# Patient Record
Sex: Female | Born: 1965 | Hispanic: Yes | Marital: Married
Health system: Southern US, Community
[De-identification: ages and names within clinical notes are randomized; demographics above are authoritative.]

---

## 2009-01-11 ENCOUNTER — Emergency Department: Payer: Self-pay | Admitting: Emergency Medicine

## 2013-06-23 LAB — URINALYSIS, COMPLETE
Bacteria: NONE SEEN
Bilirubin,UR: NEGATIVE
Blood: NEGATIVE
Ketone: NEGATIVE
Leukocyte Esterase: NEGATIVE
Nitrite: NEGATIVE
Protein: 30
Specific Gravity: 1.017 (ref 1.003–1.030)
Squamous Epithelial: 6
WBC UR: 1 /HPF (ref 0–5)

## 2013-06-23 LAB — CBC
HGB: 14.2 g/dL (ref 12.0–16.0)
MCH: 31.9 pg (ref 26.0–34.0)
MCHC: 34.9 g/dL (ref 32.0–36.0)
MCV: 92 fL (ref 80–100)
Platelet: 261 10*3/uL (ref 150–440)
RDW: 12.8 % (ref 11.5–14.5)
WBC: 13.1 10*3/uL — ABNORMAL HIGH (ref 3.6–11.0)

## 2013-06-23 LAB — COMPREHENSIVE METABOLIC PANEL
Albumin: 4.5 g/dL (ref 3.4–5.0)
Anion Gap: 8 (ref 7–16)
Bilirubin,Total: 1 mg/dL (ref 0.2–1.0)
Calcium, Total: 9.4 mg/dL (ref 8.5–10.1)
Chloride: 100 mmol/L (ref 98–107)
Co2: 26 mmol/L (ref 21–32)
Creatinine: 0.69 mg/dL (ref 0.60–1.30)
EGFR (African American): >60
EGFR (Non-African Amer.): >60
Glucose: 124 mg/dL — ABNORMAL HIGH (ref 65–99)
Osmolality: 269 (ref 275–301)
SGOT(AST): 21 U/L (ref 15–37)
Sodium: 134 mmol/L — ABNORMAL LOW (ref 136–145)
Total Protein: 8.3 g/dL — ABNORMAL HIGH (ref 6.4–8.2)

## 2013-06-23 LAB — LIPASE, BLOOD: Lipase: 92 U/L (ref 73–393)

## 2013-06-23 LAB — PREGNANCY, URINE: Pregnancy Test, Urine: NEGATIVE m[IU]/mL

## 2013-06-24 ENCOUNTER — Inpatient Hospital Stay: Payer: Self-pay | Admitting: Surgery

## 2013-06-25 LAB — CBC WITH DIFFERENTIAL/PLATELET
Basophil #: 0 10*3/uL (ref 0.0–0.1)
Basophil %: 0.2 %
Eosinophil #: 0 10*3/uL (ref 0.0–0.7)
Eosinophil %: 0 %
HCT: 34.7 % — ABNORMAL LOW (ref 35.0–47.0)
HGB: 12.3 g/dL (ref 12.0–16.0)
Lymphocyte #: 1.5 10*3/uL (ref 1.0–3.6)
Lymphocyte %: 17.1 %
MCH: 32.2 pg (ref 26.0–34.0)
MCHC: 35.4 g/dL (ref 32.0–36.0)
Monocyte %: 9.8 %
Neutrophil #: 6.4 10*3/uL (ref 1.4–6.5)
Neutrophil %: 72.9 %
RDW: 12.6 % (ref 11.5–14.5)
WBC: 8.7 10*3/uL (ref 3.6–11.0)

## 2013-06-27 ENCOUNTER — Emergency Department: Payer: Self-pay | Admitting: Emergency Medicine

## 2013-06-27 LAB — URINALYSIS, COMPLETE
Bacteria: NONE SEEN
Glucose,UR: NEGATIVE mg/dL (ref 0–75)
Ketone: NEGATIVE
Leukocyte Esterase: NEGATIVE
Ph: 7 (ref 4.5–8.0)
Protein: NEGATIVE
RBC,UR: 1 /HPF (ref 0–5)
Specific Gravity: 1.016 (ref 1.003–1.030)
Squamous Epithelial: 5
WBC UR: 1 /HPF (ref 0–5)

## 2013-06-27 LAB — COMPREHENSIVE METABOLIC PANEL
Albumin: 3.9 g/dL (ref 3.4–5.0)
Anion Gap: 7 (ref 7–16)
BUN: 11 mg/dL (ref 7–18)
Calcium, Total: 9.4 mg/dL (ref 8.5–10.1)
Co2: 25 mmol/L (ref 21–32)
Creatinine: 0.59 mg/dL — ABNORMAL LOW (ref 0.60–1.30)
EGFR (Non-African Amer.): >60
Osmolality: 268 (ref 275–301)
Potassium: 3.5 mmol/L (ref 3.5–5.1)
SGOT(AST): 28 U/L (ref 15–37)
SGPT (ALT): 48 U/L (ref 12–78)
Total Protein: 7.7 g/dL (ref 6.4–8.2)

## 2013-06-27 LAB — CBC
HCT: 39.4 % (ref 35.0–47.0)
HGB: 13.9 g/dL (ref 12.0–16.0)
MCH: 32.2 pg (ref 26.0–34.0)
MCHC: 35.4 g/dL (ref 32.0–36.0)
MCV: 91 fL (ref 80–100)
Platelet: 257 10*3/uL (ref 150–440)
RBC: 4.33 10*6/uL (ref 3.80–5.20)
RDW: 12.8 % (ref 11.5–14.5)
WBC: 10.3 10*3/uL (ref 3.6–11.0)

## 2015-01-08 NOTE — H&P (Signed)
Subjective/Chief Complaint RUQ pain   History of Present Illness 2 days RUQ pain rad to back nausea, mul;t emesis no jaundice no prior episode  Hx taken with interpretor   Past History HTN tubal lig   Past Medical Health Hypertension   Past Med/Surgical Hx:  HTN:   tubal ligation:   ALLERGIES:  No Known Allergies:   Family and Social History:  Family History father had chole   Social History negative tobacco, negative ETOH   Place of Living Home   Review of Systems:  Fever/Chills No   Cough No   Abdominal Pain Yes   Diarrhea No   Constipation No   Nausea/Vomiting Yes   SOB/DOE No   Chest Pain No   Dysuria No   Tolerating Diet No  Nauseated  Vomiting   Medications/Allergies Reviewed Medications/Allergies reviewed   Physical Exam:  GEN no acute distress   HEENT pink conjunctivae   NECK supple   RESP normal resp effort  clear BS  no use of accessory muscles   CARD regular rate   ABD positive tenderness  soft  pos Murphy's sign, umbilical scar   LYMPH negative neck   EXTR negative edema   SKIN normal to palpation   PSYCH alert, A+O to time, place, person, good insight   Lab Results: Hepatic:  06-Oct-14 21:13   Bilirubin, Total 1.0  Alkaline Phosphatase 108  SGPT (ALT) 23  SGOT (AST) 21  Total Protein, Serum  8.3  Albumin, Serum 4.5  Routine Chem:  06-Oct-14 21:13   Glucose, Serum  124  BUN 10  Creatinine (comp) 0.69  Sodium, Serum  134  Potassium, Serum  3.2  Chloride, Serum 100  CO2, Serum 26  Calcium (Total), Serum 9.4  Osmolality (calc) 269  eGFR (African American) >60  eGFR (Non-African American) >60 (eGFR values <71m/min/1.73 m2 may be an indication of chronic kidney disease (CKD). Calculated eGFR is useful in patients with stable renal function. The eGFR calculation will not be reliable in acutely ill patients when serum creatinine is changing rapidly. It is not useful in  patients on dialysis. The eGFR  calculation may not be applicable to patients at the low and high extremes of body sizes, pregnant women, and vegetarians.)  Anion Gap 8  Lipase 92 (Result(s) reported on 23 Jun 2013 at 09:33PM.)  Routine UA:  06-Oct-14 21:13   Color (UA) Yellow  Clarity (UA) Clear  Glucose (UA) Negative  Bilirubin (UA) Negative  Ketones (UA) Negative  Specific Gravity (UA) 1.017  Blood (UA) Negative  pH (UA) 6.0  Protein (UA) 30 mg/dL  Nitrite (UA) Negative  Leukocyte Esterase (UA) Negative (Result(s) reported on 23 Jun 2013 at 09:39PM.)  RBC (UA) 1 /HPF  WBC (UA) 1 /HPF  Bacteria (UA) NONE SEEN  Epithelial Cells (UA) 6 /HPF  Mucous (UA) PRESENT (Result(s) reported on 23 Jun 2013 at 09:39PM.)  Routine Sero:  06-Oct-14 21:13   Pregnancy Test, Urine NEGATIVE (The results of the qualitative urine HCG (Pregnancy Test) should be evaluated in light of other clinical information.  There are limitations to the test which, in certain clinical situations, may result in a false positive or negative result. Thehigh dose hook effect can occur in urine samples with extremely high HCG concentrations.  This effect can produce a negative result in certain situations. It is suggested that results of the qualitative HCG be confirmed by an alternate methodology, such as the quantitative serum beta HCG test.)  Routine Hem:  06-Oct-14  21:13   WBC (CBC)  13.1  RBC (CBC) 4.44  Hemoglobin (CBC) 14.2  Hematocrit (CBC) 40.6  Platelet Count (CBC) 261 (Result(s) reported on 23 Jun 2013 at 09:39PM.)  MCV 92  MCH 31.9  MCHC 34.9  RDW 12.8    Assessment/Admission Diagnosis u/s with thick wall acute chole lap chole, risks options see dictation   Electronic Signatures: Florene Glen (MD)  (Signed 07-Oct-14 04:50)  Authored: CHIEF COMPLAINT and HISTORY, PAST MEDICAL/SURGIAL HISTORY, ALLERGIES, FAMILY AND SOCIAL HISTORY, REVIEW OF SYSTEMS, PHYSICAL EXAM, LABS, ASSESSMENT AND PLAN   Last Updated: 07-Oct-14  04:50 by Florene Glen (MD)

## 2015-01-08 NOTE — H&P (Signed)
PATIENT NAME:  Christine Snow, Christine Snow MR#:  [id number] DATE OF BIRTH:  March 09, 1966  DATE OF ADMISSION:  06/24/2013  CHIEF COMPLAINT: Right upper quadrant pain.   HISTORY OF PRESENT ILLNESS: This is a Hispanic Spanish-speaking patient, who is interviewed via the interpreter, who states that she has had pain since Sunday, 2 days ago. It has been worsening. She has had nausea and multiple emeses. Never had an episode like this before. Denies fevers or chills. Denies jaundice or acholic stools. Pain is in the right upper quadrant and radiates through to her back and shoulder.   PAST MEDICAL HISTORY: Hypertension.   PAST SURGICAL HISTORY: Tubal ligation.   FAMILY HISTORY: Her father had gallbladder disease and had a cholecystectomy.   ALLERGIES: None.   MEDICATIONS: Accupril and hydrochlorothiazide.   SOCIAL HISTORY: The patient lives at home with her family. Does not smoke or drink.   REVIEW OF SYSTEMS: A 10 system review has been performed and negative with the exception of that mentioned in the HPI.    PHYSICAL EXAMINATION:  GENERAL: Healthy, comfortable-appearing, Hispanic female patient.  VITAL SIGNS: She is afebrile at 98.1, pulse 81, respirations 16, blood pressure 149/79, pain scale of 6 and 98% room air sat.  HEENT: No scleral icterus.  NECK: No palpable neck nodes.  CHEST: Clear to auscultation.  CARDIAC: Regular rate and rhythm.  ABDOMEN: Soft. There is tenderness in the right upper quadrant with a probable Murphy's sign. Scar in the infraumbilical area from tubal ligation. No peritoneal signs otherwise.  EXTREMITIES: Without edema.  NEUROLOGIC: Grossly intact.  INTEGUMENT: No jaundice.   LABORATORY VALUES: Demonstrate an elevated white blood cell count of 13,000. Potassium is 3.2. Sodium is 134. LFTs are within normal limits.   An ultrasound was reported to me by the Emergency Room physician suggesting positive sonographic Murphy sign as well as gallstones with thickened gallbladder  wall.   ASSESSMENT AND PLAN: Via the interpreter, I discussed the options of observation versus admission and surgical intervention in the form of a cholecystectomy. The rationale for this has been discussed. The option of observation was reviewed, and the risks of bleeding, infection, recurrent symptoms, failure to resolve her symptoms, conversion to an open procedure, bile duct damage, bile duct leak, retained common bile duct stone, any of which could require further surgery and/or ERCP, stent and papillotomy were all discussed with she and her family present. They understood and agreed to proceed. Questions were answered for them.    ____________________________ Adah Salvageichard E. Excell Seltzerooper, MD rec:gb D: 06/24/2013 04:54:03 ET T: 06/24/2013 05:47:13 ET JOB#: [id number]  cc: Adah Salvageichard E. Excell Seltzerooper, MD, <Dictator> Lattie HawICHARD E Yaqub Arney MD ELECTRONICALLY SIGNED 06/24/2013 6:33

## 2015-01-08 NOTE — Op Note (Signed)
PATIENT NAME:  Christine Snow, Christine MR#:  Snow DATE OF BIRTH:  08-04-1966  DATE OF PROCEDURE:  06/24/2013  PREOPERATIVE DIAGNOSIS: Acute cholecystitis.   POSTOPERATIVE DIAGNOSIS: Acute cholecystitis.   PROCEDURE PERFORMED: Laparoscopic cholecystectomy, attempted cholangiography.   SURGEON: Natale LayMark Roverto Bodmer, M.D. FACS  ASSISTANT: None.   ANESTHESIA: General endotracheal.   FINDINGS: Acute and chronic cholecystitis. Unable to perform the cholangiogram due to scarification of the cystic duct.   ESTIMATED BLOOD LOSS: 25 mL.   DRAINS: 19 French Jackson-Pratt in CarmichaelsMorison pouch.   DESCRIPTION OF PROCEDURE: With informed consent, supine position, general endotracheal anesthesia, the patient's left arm was padded and tucked at her side. Her abdomen was widely prepped and draped with ChloraPrep solution. Timeout was observed. A 12 mm blunt Hasson trocar was placed through an open technique under direct visualization with stay sutures being passed through the fascia and pneumoperitoneum was established. 30 degreed angled scope was utilized. The patient was then positioned in reverse Trendelenburg and airplane right side up.   A 5 mm followed by then a 12 mm trocar was placed in the epigastric region for stapling purposes. Then 5 mm bladeless trocars were placed x 2 in the subcostal margin on the right side. The gallbladder was decompressed of hydropic fluid of approximately 45 mL with an aspiration cannula. The gallbladder was grasped along its fundus and elevated towards the right shoulder. Lateral retraction was achieved on Hartman pouch. There was a thickened acute and chronic inflammatory rind around the hepatoduodenal ligament which was carefully dissected with a combination of sharp dissection, hook cautery, and blunt technique, liberating a thickened cystic duct. The common bile duct appeared to be dilated. There was clear separation between the cystic duct and the common bile duct, and a critical view of  safety was able to be achieved. The cystic artery was likewise divided with a posterior branch.   Attempt at cholangiography with a Kumar catheter was unsuccessful. I could not cannulate the cystic duct. A 10 mm Hemoclip was then placed across the base of the gallbladder. A transversely oriented cystic ductotomy was fashioned with scissors. A Reddick catheter was brought onto the field, being prepared for cholangiography; however, despite multiple attempts, it could not be threaded through the cystic duct. The cystic duct being rather large was then divided with a fire of the GIA 35 stapler with blue load application.   At this point further dissection in this area demonstrated no aberrant artery or bile duct, and the gallbladder was retrieved off the gallbladder fossa utilizing hook cautery apparatus, being captured in an Endo Catch device and retrieved.   General exploration of the abdomen demonstrated no other findings. The right upper quadrant during the case was irrigated with a total of 2 liters of warm normal saline being aspirated dry, hemostasis being obtained on the operative field with point cautery. A 19 mm Blake drain was directed into Morison pouch and exited the right lower quadrant port site. Drain site was secured with nylon suture. With hemostasis being ensured on the operative field, ports were then removed under direct visualization. The infraumbilical fascial defect being reapproximated with multiple figure-of-eight simple 0 Vicryl sutures in vertical orientation. A total of 30 mL 0.25% plain Marcaine was infiltrated on the operative field. Skin edges were reapproximated utilizing a skin stapler. Sterile dressings were applied. The patient was subsequently extubated and taken to the recovery room in stable and satisfactory condition by anesthesia services.     ____________________________ Redge GainerMark A. Egbert GaribaldiBird, MD mab:np D:  06/25/2013 14:11:00 ET T: 06/25/2013 15:29:27  ET JOB#: [id number]  cc: Loraine Leriche A. Egbert Garibaldi, MD, <Dictator> Raynald Kemp MD ELECTRONICALLY SIGNED 06/29/2013 16:50

## 2016-01-26 ENCOUNTER — Other Ambulatory Visit: Payer: Self-pay | Admitting: Physician Assistant

## 2016-01-26 DIAGNOSIS — Z1231 Encounter for screening mammogram for malignant neoplasm of breast: Secondary | ICD-10-CM

## 2016-02-03 ENCOUNTER — Ambulatory Visit
Admission: RE | Admit: 2016-02-03 | Discharge: 2016-02-03 | Disposition: A | Discharge status: Home / Self Care | Payer: BLUE CROSS/BLUE SHIELD | Source: Ambulatory Visit | Attending: Physician Assistant | Admitting: Physician Assistant

## 2016-02-03 DIAGNOSIS — Z1231 Encounter for screening mammogram for malignant neoplasm of breast: Secondary | ICD-10-CM

## 2018-02-01 ENCOUNTER — Other Ambulatory Visit: Payer: Self-pay | Admitting: Physician Assistant

## 2018-02-01 DIAGNOSIS — Z1231 Encounter for screening mammogram for malignant neoplasm of breast: Secondary | ICD-10-CM

## 2018-02-20 ENCOUNTER — Ambulatory Visit
Admission: RE | Admit: 2018-02-20 | Discharge: 2018-02-20 | Disposition: A | Discharge status: Home / Self Care | Payer: BLUE CROSS/BLUE SHIELD | Source: Ambulatory Visit | Attending: Physician Assistant | Admitting: Physician Assistant

## 2018-02-20 DIAGNOSIS — Z1231 Encounter for screening mammogram for malignant neoplasm of breast: Secondary | ICD-10-CM | POA: Insufficient documentation

## 2019-03-13 ENCOUNTER — Other Ambulatory Visit: Payer: Self-pay | Admitting: Physician Assistant

## 2019-03-13 DIAGNOSIS — Z1231 Encounter for screening mammogram for malignant neoplasm of breast: Secondary | ICD-10-CM

## 2019-04-23 ENCOUNTER — Other Ambulatory Visit: Payer: Self-pay

## 2019-04-23 ENCOUNTER — Ambulatory Visit
Admission: RE | Admit: 2019-04-23 | Discharge: 2019-04-23 | Disposition: A | Discharge status: Home / Self Care | Payer: BC Managed Care – PPO | Source: Ambulatory Visit | Attending: Physician Assistant | Admitting: Physician Assistant

## 2019-04-23 DIAGNOSIS — Z1231 Encounter for screening mammogram for malignant neoplasm of breast: Secondary | ICD-10-CM | POA: Insufficient documentation

## 2020-04-14 ENCOUNTER — Other Ambulatory Visit: Payer: Self-pay | Admitting: Student

## 2020-04-14 DIAGNOSIS — Z1231 Encounter for screening mammogram for malignant neoplasm of breast: Secondary | ICD-10-CM

## 2020-05-11 ENCOUNTER — Other Ambulatory Visit: Payer: Self-pay

## 2020-05-11 ENCOUNTER — Ambulatory Visit
Admission: RE | Admit: 2020-05-11 | Discharge: 2020-05-11 | Disposition: A | Discharge status: Home / Self Care | Payer: BC Managed Care – PPO | Source: Ambulatory Visit | Attending: Student | Admitting: Student

## 2020-05-11 DIAGNOSIS — Z1231 Encounter for screening mammogram for malignant neoplasm of breast: Secondary | ICD-10-CM | POA: Diagnosis not present

## 2021-02-03 IMAGING — MG DIGITAL SCREENING BILAT W/ TOMO W/ CAD
6 of 10 series · 6 of 30 positions shown · non-contrast
Comparison: Previous exam(s).

CLINICAL DATA: Screening.

EXAM:
DIGITAL SCREENING BILATERAL MAMMOGRAM WITH TOMO AND CAD

[L MLO synth-2D (1 of 2)]
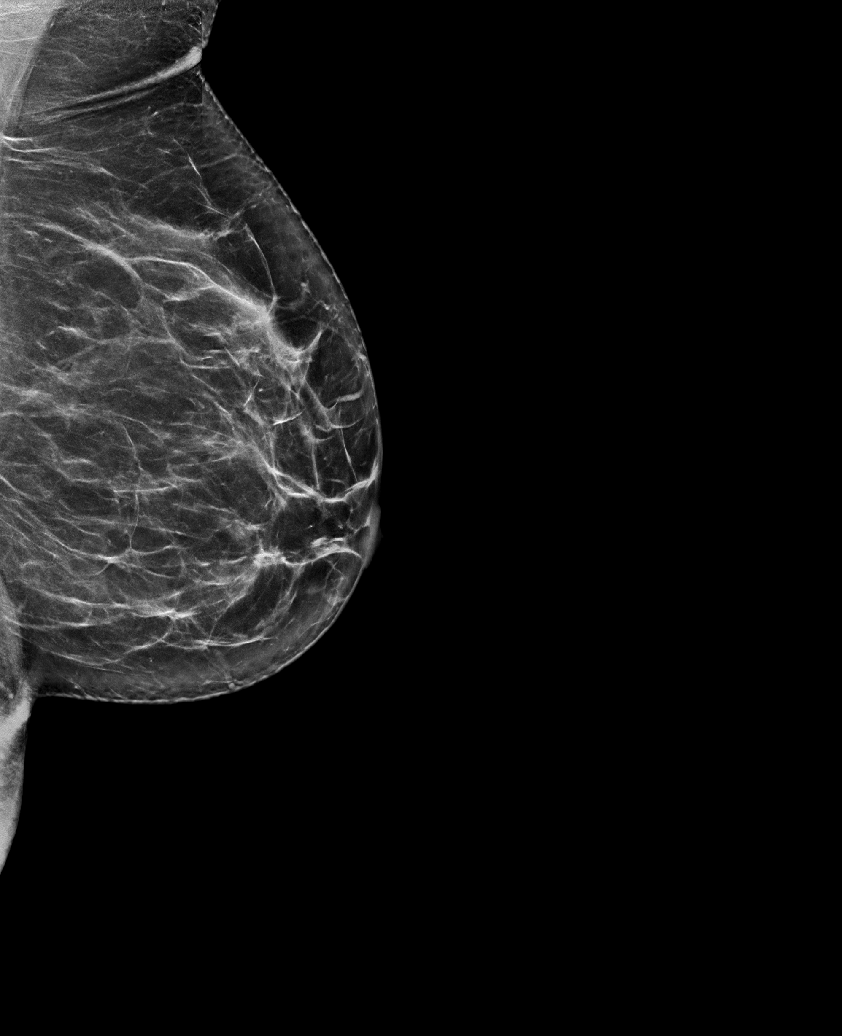

[R CC synth-2D]
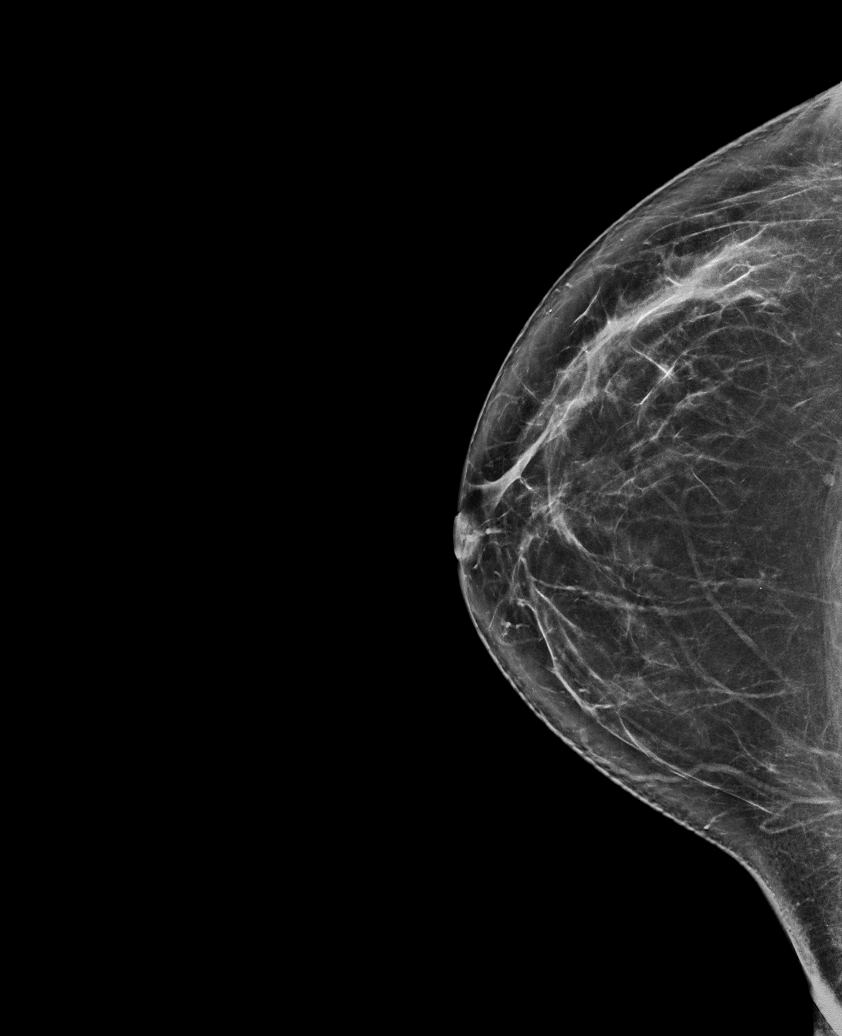

[L MLO synth-2D (2 of 2)]
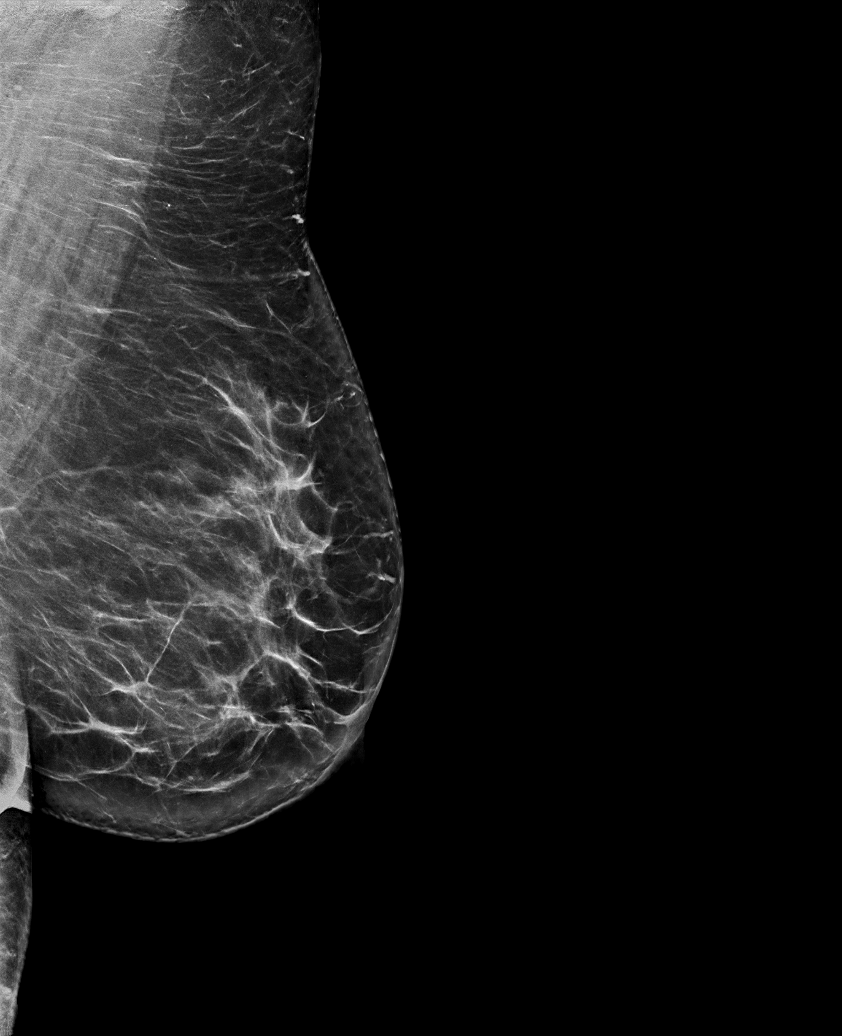

[R MLO synth-2D]
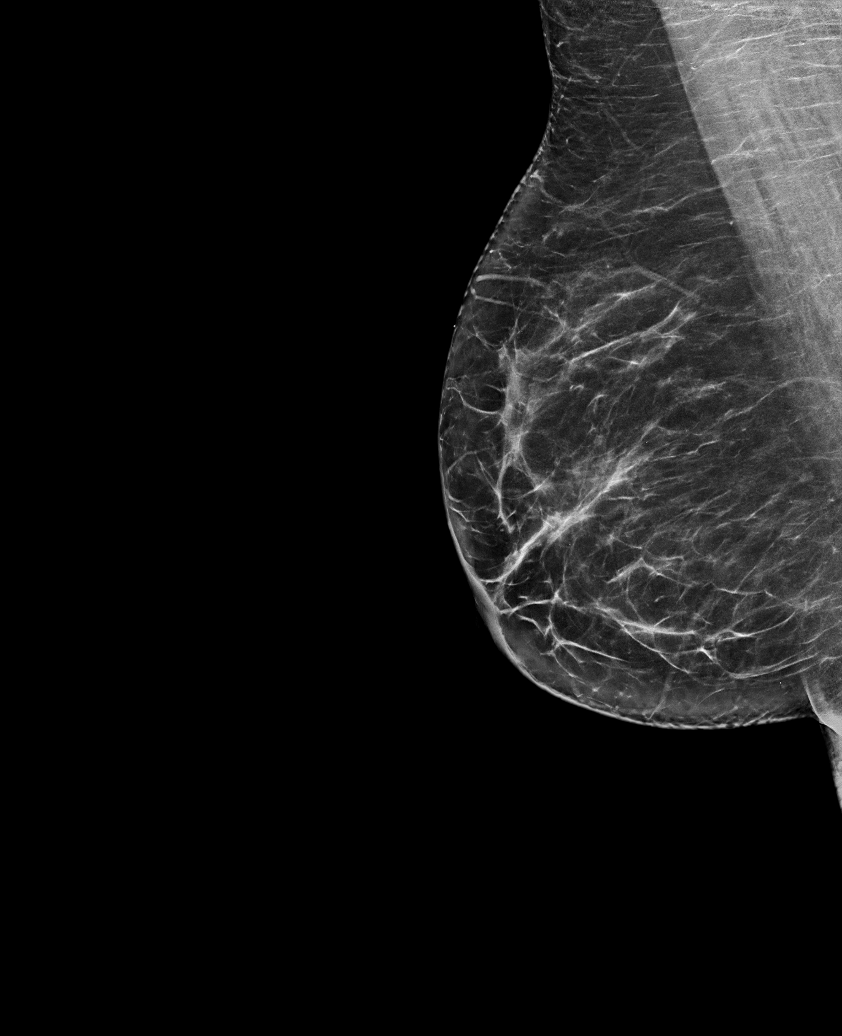

[L CC synth-2D]
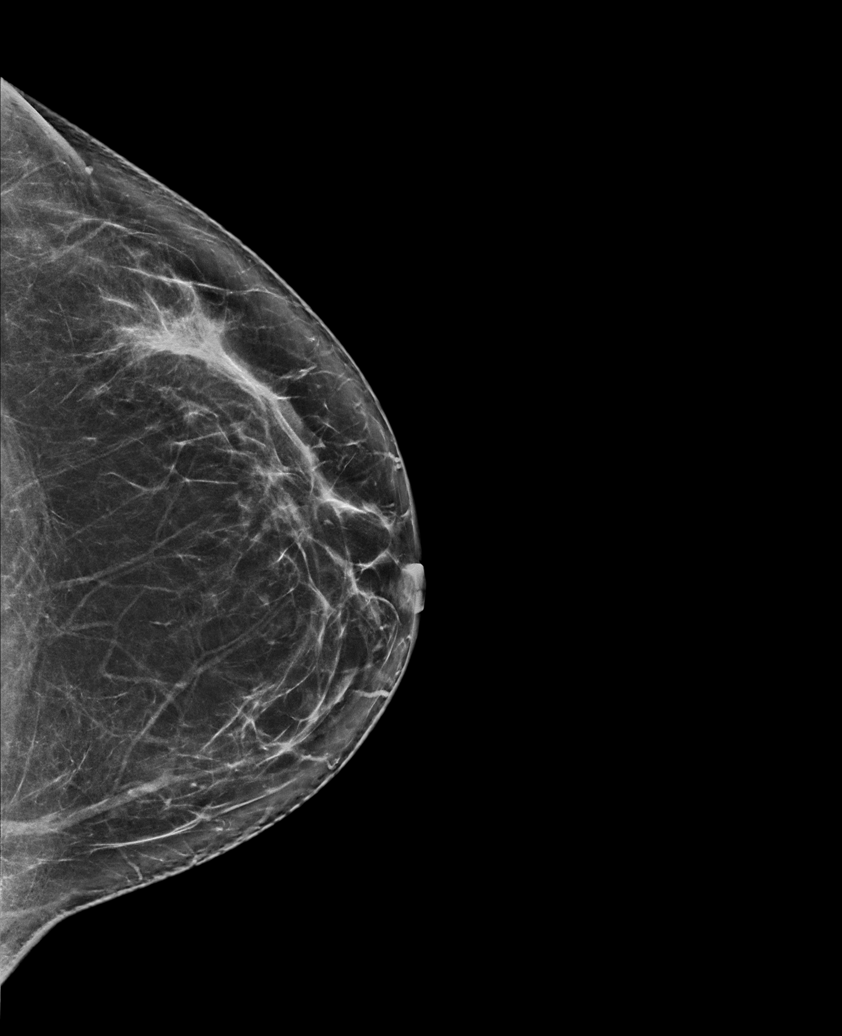

[L MLO tomo · tomo slice 41/82.0]
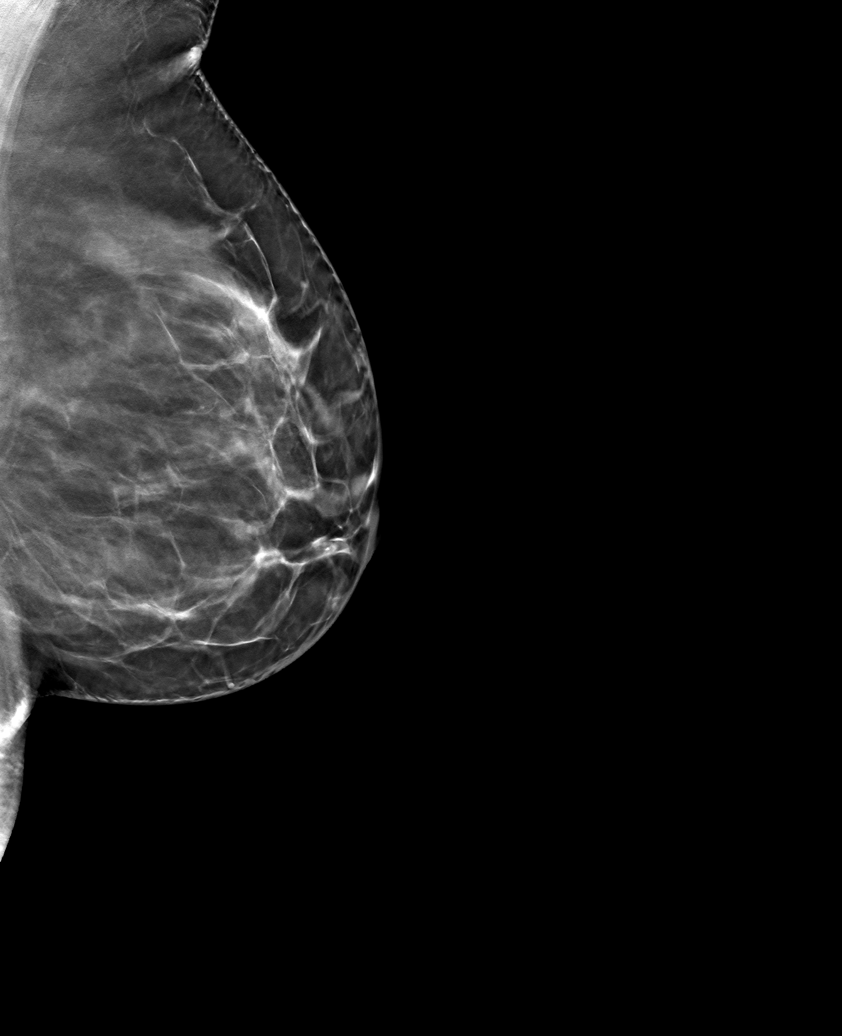

[6 of 30 positions shown; findings below may reference images not displayed]

ACR Breast Density Category b: There are scattered areas of
fibroglandular density.
FINDINGS: There are no findings suspicious for malignancy. Images were
processed with CAD.
IMPRESSION: No mammographic evidence of malignancy. A result letter of this
screening mammogram will be mailed directly to the patient.

RECOMMENDATION:
Screening mammogram in one year. (Code:[id number])

BI-RADS CATEGORY  1: Negative.

## 2021-04-07 ENCOUNTER — Other Ambulatory Visit: Payer: Self-pay | Admitting: Student

## 2021-04-07 DIAGNOSIS — Z1231 Encounter for screening mammogram for malignant neoplasm of breast: Secondary | ICD-10-CM

## 2021-05-12 ENCOUNTER — Other Ambulatory Visit: Payer: Self-pay

## 2021-05-12 ENCOUNTER — Ambulatory Visit
Admission: RE | Admit: 2021-05-12 | Discharge: 2021-05-12 | Disposition: A | Discharge status: Home / Self Care | Payer: BC Managed Care – PPO | Source: Ambulatory Visit | Attending: Obstetrics and Gynecology | Admitting: Obstetrics and Gynecology

## 2021-05-12 DIAGNOSIS — Z1231 Encounter for screening mammogram for malignant neoplasm of breast: Secondary | ICD-10-CM | POA: Insufficient documentation

## 2022-06-30 ENCOUNTER — Other Ambulatory Visit: Payer: Self-pay | Admitting: Obstetrics and Gynecology

## 2022-06-30 DIAGNOSIS — Z1231 Encounter for screening mammogram for malignant neoplasm of breast: Secondary | ICD-10-CM

## 2022-08-07 ENCOUNTER — Ambulatory Visit
Admission: RE | Admit: 2022-08-07 | Discharge: 2022-08-07 | Disposition: A | Discharge status: Home / Self Care | Payer: BC Managed Care – PPO | Source: Ambulatory Visit | Attending: Obstetrics and Gynecology | Admitting: Obstetrics and Gynecology

## 2022-08-07 DIAGNOSIS — Z1231 Encounter for screening mammogram for malignant neoplasm of breast: Secondary | ICD-10-CM | POA: Insufficient documentation

## 2023-07-26 ENCOUNTER — Encounter: Payer: Self-pay | Admitting: Family Medicine

## 2023-07-26 DIAGNOSIS — Z1231 Encounter for screening mammogram for malignant neoplasm of breast: Secondary | ICD-10-CM

## 2023-07-27 ENCOUNTER — Other Ambulatory Visit: Payer: Self-pay | Admitting: Family Medicine

## 2023-07-27 DIAGNOSIS — Z1231 Encounter for screening mammogram for malignant neoplasm of breast: Secondary | ICD-10-CM

## 2023-09-18 ENCOUNTER — Ambulatory Visit: Payer: BC Managed Care – PPO

## 2023-09-27 ENCOUNTER — Ambulatory Visit
Admission: RE | Admit: 2023-09-27 | Discharge: 2023-09-27 | Disposition: A | Discharge status: Home / Self Care | Payer: BC Managed Care – PPO | Source: Ambulatory Visit | Attending: Family Medicine | Admitting: Family Medicine

## 2023-09-27 DIAGNOSIS — Z1231 Encounter for screening mammogram for malignant neoplasm of breast: Secondary | ICD-10-CM | POA: Insufficient documentation

## 2024-04-25 ENCOUNTER — Other Ambulatory Visit: Payer: Self-pay | Admitting: Student

## 2024-04-25 ENCOUNTER — Other Ambulatory Visit: Payer: Self-pay

## 2024-04-25 DIAGNOSIS — R1012 Left upper quadrant pain: Secondary | ICD-10-CM

## 2024-04-28 ENCOUNTER — Other Ambulatory Visit: Payer: Self-pay | Admitting: Obstetrics and Gynecology

## 2024-04-28 DIAGNOSIS — R1012 Left upper quadrant pain: Secondary | ICD-10-CM

## 2024-05-07 ENCOUNTER — Ambulatory Visit
Admission: RE | Admit: 2024-05-07 | Discharge: 2024-05-07 | Disposition: A | Discharge status: Home / Self Care | Source: Ambulatory Visit | Attending: Obstetrics and Gynecology | Admitting: Obstetrics and Gynecology

## 2024-05-07 DIAGNOSIS — R1012 Left upper quadrant pain: Secondary | ICD-10-CM | POA: Insufficient documentation

## 2024-05-07 MED ORDER — IOHEXOL 300 MG/ML  SOLN
100.0000 mL | Freq: Once | INTRAMUSCULAR | Status: AC | PRN
  Administered 2024-05-07: 100 mL via INTRAVENOUS
